# Patient Record
Sex: Male | Born: 2000 | Race: Black or African American | Hispanic: No | Marital: Single | Smoking: Never smoker
Health system: Southern US, Community
[De-identification: ages and names within clinical notes are randomized; demographics above are authoritative.]

---
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
## 2024-08-25 ENCOUNTER — Ambulatory Visit
Admission: EM | Admit: 2024-08-25 | Discharge: 2024-08-25 | Disposition: A | Discharge status: Home / Self Care | Attending: Physician Assistant | Admitting: Physician Assistant

## 2024-08-25 ENCOUNTER — Encounter: Payer: Self-pay | Admitting: Emergency Medicine

## 2024-08-25 DIAGNOSIS — L42 Pityriasis rosea: Secondary | ICD-10-CM

## 2024-08-25 MED ORDER — HYDROXYZINE HCL 25 MG PO TABS: 25.0000 mg | ORAL_TABLET | Freq: Four times a day (QID) | ORAL | 0 refills | Status: AC | PRN

## 2024-08-25 NOTE — ED Provider Notes (Signed)
 [facility]    CSN: [id number] Arrival date & time: 08/25/24  1153      History   Chief Complaint Chief Complaint  Patient presents with   Rash    HPI Gregory Olsen is a 23 y.o. male presenting for ~2 week history of rash of chest, abdomen and back. Reports intermittent pruritus. Denies pain, fever, URI symptoms, abdominal pain or vomiting. No change in meds. No contacts with similar rash. No treatment so far.  HPI  History reviewed. No pertinent past medical history.  There are no active problems to display for this patient.   History reviewed. No pertinent surgical history.     Home Medications    Prior to Admission medications   Medication Sig Start Date End Date Taking? Authorizing Provider  hydrOXYzine  (ATARAX ) 25 MG tablet Take 1 tablet (25 mg total) by mouth every 6 (six) hours as needed for itching. 08/25/24  Yes Arvis Jolan KATHEE PA-C    Family History History reviewed. No pertinent family history.  Social History Social History   Tobacco Use   Smoking status: Never   Smokeless tobacco: Never  Vaping Use   Vaping status: Never Used  Substance Use Topics   Alcohol use: Not Currently   Drug use: Never     Allergies   Patient has no known allergies.   Review of Systems Review of Systems  Constitutional:  Negative for fatigue and fever.  HENT:  Negative for congestion.   Respiratory:  Negative for cough and shortness of breath.   Gastrointestinal:  Negative for abdominal pain and vomiting.  Musculoskeletal:  Negative for myalgias.  Skin:  Positive for rash.  Neurological:  Negative for weakness.     Physical Exam Triage Vital Signs ED Triage Vitals  Encounter Vitals Group     BP 08/25/24 1218 (!) 154/84     Girls Systolic BP Percentile --      Girls Diastolic BP Percentile --      Boys Systolic BP Percentile --      Boys Diastolic BP Percentile --      Pulse Rate 08/25/24 1218 61     Resp 08/25/24 1218 15     Temp  08/25/24 1218 97.8 F (36.6 C)     Temp Source 08/25/24 1218 Oral     SpO2 08/25/24 1218 96 %     Weight 08/25/24 1215 153 lb (69.4 kg)     Height 08/25/24 1215 5' 7 (1.702 m)     Head Circumference --      Peak Flow --      Pain Score 08/25/24 1215 0     Pain Loc --      Pain Education --      Exclude from Growth Chart --    No data found.  Updated Vital Signs BP (!) 154/84 (BP Location: Left Arm)   Pulse 61   Temp 97.8 F (36.6 C) (Oral)   Resp 15   Ht 5' 7 (1.702 m)   Wt 153 lb (69.4 kg)   SpO2 96%   BMI 23.96 kg/m   Physical Exam Vitals and nursing note reviewed.  Constitutional:      General: He is not in acute distress.    Appearance: Normal appearance. He is well-developed. He is not ill-appearing.  HENT:     Head: Normocephalic and atraumatic.  Eyes:     Conjunctiva/sclera: Conjunctivae normal.  Cardiovascular:     Rate and Rhythm: Normal rate and regular  rhythm.  Pulmonary:     Effort: Pulmonary effort is normal. No respiratory distress.     Breath sounds: Normal breath sounds.  Musculoskeletal:     Cervical back: Neck supple.  Skin:    General: Skin is warm and dry.     Capillary Refill: Capillary refill takes less than 2 seconds.     Findings: Rash present.  Neurological:     General: No focal deficit present.     Mental Status: He is alert. Mental status is at baseline.     Motor: No weakness.     Gait: Gait normal.  Psychiatric:        Mood and Affect: Mood normal.        Behavior: Behavior normal.        Hyperpigmented round lesions on trunk   UC Treatments / Results  Labs (all labs ordered are listed, but only abnormal results are displayed) Labs Reviewed - No data to display  EKG   Radiology No results found.  Procedures Procedures (including critical care time)  Medications Ordered in UC Medications - No data to display  Initial Impression / Assessment and Plan / UC Course  I have reviewed the triage vital signs and  the nursing notes.  Pertinent labs & imaging results that were available during my care of the patient were reviewed by me and considered in my medical decision making (see chart for details).   23 year old male presents for round hyperpigmented flat and slightly raised lesions of the trunk x 2 weeks.  Reports intermittent pruritus but no associated pain.  See images including chart.  Most consistent with pityriasis rosea.  Advised patient this is a self-limited condition.  Sent hydroxyzine  as needed.  Practice good hygiene.  Monitor.  Patient education handouts given to patient from [facility] and up-to-date resources.   Final Clinical Impressions(s) / UC Diagnoses   Final diagnoses:  Pityriasis rosea     Discharge Instructions      - Rash appears to be consistent with a condition called pityriasis rosea.  Please read the handouts.  This is a self-limited type rash that usually resolves within a few weeks. - I sent something for you to take as needed for itching.   ED Prescriptions     Medication Sig Dispense Auth. Provider   hydrOXYzine  (ATARAX ) 25 MG tablet Take 1 tablet (25 mg total) by mouth every 6 (six) hours as needed for itching. 30 tablet Keyonna Comunale B, PA-C      PDMP not reviewed this encounter.   Arvis Jolan NOVAK, PA-C 08/25/24 1246

## 2024-08-25 NOTE — ED Triage Notes (Signed)
 Patient reports rash on his abdomen, chest and shoulders for over a week.  Patient reports itching off and on.  Patient denies fevers.  Patient has not put anything on the areas except for lotion.

## 2024-08-25 NOTE — Discharge Instructions (Addendum)
-   Rash appears to be consistent with a condition called pityriasis rosea.  Please read the handouts.  This is a self-limited type rash that usually resolves within a few weeks. - I sent something for you to take as needed for itching.
